# Patient Record
Sex: Female | Born: 1970 | Race: White | Hispanic: No | Marital: Single | Smoking: Never smoker
Health system: Southern US, Community
[De-identification: ages and names within clinical notes are randomized; demographics above are authoritative.]

## PROBLEM LIST (undated history)

## (undated) ENCOUNTER — Ambulatory Visit (HOSPITAL_COMMUNITY): Payer: BLUE CROSS/BLUE SHIELD

## (undated) DIAGNOSIS — E079 Disorder of thyroid, unspecified: Secondary | ICD-10-CM

---

## 2000-04-03 ENCOUNTER — Other Ambulatory Visit: Admission: RE | Admit: 2000-04-03 | Discharge: 2000-04-03 | Payer: Self-pay | Admitting: *Deleted

## 2000-09-23 ENCOUNTER — Inpatient Hospital Stay (HOSPITAL_COMMUNITY): Admission: AD | Admit: 2000-09-23 | Discharge: 2000-09-23 | Payer: Self-pay | Admitting: *Deleted

## 2000-10-25 ENCOUNTER — Inpatient Hospital Stay (HOSPITAL_COMMUNITY): Admission: AD | Admit: 2000-10-25 | Discharge: 2000-10-28 | Payer: Self-pay | Admitting: Gynecology

## 2000-10-25 ENCOUNTER — Encounter (INDEPENDENT_AMBULATORY_CARE_PROVIDER_SITE_OTHER): Payer: Self-pay

## 2000-11-29 ENCOUNTER — Inpatient Hospital Stay (HOSPITAL_COMMUNITY): Admission: AD | Admit: 2000-11-29 | Discharge: 2000-11-29 | Payer: Self-pay | Admitting: Gynecology

## 2000-12-03 ENCOUNTER — Other Ambulatory Visit: Admission: RE | Admit: 2000-12-03 | Discharge: 2000-12-03 | Payer: Self-pay | Admitting: *Deleted

## 2001-01-31 ENCOUNTER — Encounter: Payer: Self-pay | Admitting: Family Medicine

## 2001-01-31 ENCOUNTER — Encounter: Payer: Self-pay | Admitting: Gastroenterology

## 2001-01-31 ENCOUNTER — Encounter: Payer: Self-pay | Admitting: Emergency Medicine

## 2001-01-31 ENCOUNTER — Inpatient Hospital Stay (HOSPITAL_COMMUNITY): Admission: EM | Admit: 2001-01-31 | Discharge: 2001-02-04 | Payer: Self-pay | Admitting: Emergency Medicine

## 2001-02-01 ENCOUNTER — Encounter: Payer: Self-pay | Admitting: Family Medicine

## 2001-02-14 ENCOUNTER — Encounter: Admission: RE | Admit: 2001-02-14 | Discharge: 2001-02-14 | Payer: Self-pay | Admitting: Family Medicine

## 2002-12-02 ENCOUNTER — Encounter (HOSPITAL_COMMUNITY): Admission: RE | Admit: 2002-12-02 | Discharge: 2003-03-02 | Payer: Self-pay | Admitting: Family Medicine

## 2003-01-15 ENCOUNTER — Ambulatory Visit (HOSPITAL_COMMUNITY): Admission: RE | Admit: 2003-01-15 | Discharge: 2003-01-15 | Payer: Self-pay | Admitting: Endocrinology

## 2004-01-01 ENCOUNTER — Other Ambulatory Visit: Admission: RE | Admit: 2004-01-01 | Discharge: 2004-01-01 | Payer: Self-pay | Admitting: Gynecology

## 2006-07-03 ENCOUNTER — Other Ambulatory Visit: Admission: RE | Admit: 2006-07-03 | Discharge: 2006-07-03 | Payer: Self-pay | Admitting: Gynecology

## 2010-02-19 ENCOUNTER — Encounter: Payer: Self-pay | Admitting: Family Medicine

## 2010-02-20 ENCOUNTER — Encounter: Payer: Self-pay | Admitting: Endocrinology

## 2010-06-17 NOTE — Discharge Summary (Signed)
[facility]  Patient:    Stacy Dean, Stacy Dean Visit Number: [id number] MRN: [id number]          Service Type: OBS Location: 910A 9141 01 Attending Physician:  Tonye Royalty Dictated by:   Antony Contras, [facility] Proc. Date: 10/25/00 Admit Date:  10/25/2000 Discharge Date: 10/28/2000                             Discharge Summary  DISCHARGE DIAGNOSES:          1. Postdates pregnancy.                               2. Prolonged rupture of membranes.                               3. Fetal macrosomia.                               4. Request for elective permanent                                  sterilization.  PROCEDURES:                   _______ low transverse cesarean section and                               bilateral Pomeroy-type tubal sterilization.  HISTORY OF PRESENT ILLNESS:   The patient is a 40 year old, gravid 2, para 1-0-0-1, with an LMP of February 06, 2000, [facility] October 15, 2000. 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  PRENATAL LABORATORY DATA:     Blood type O positive, antibody screen negative, RPR, HBsAg, HIV nonreactive, rubella immune. MSAFP normal. GBS is negative.  HOSPITAL COURSE/TREATMENT:    Patient was admitted on October 25, 2000 with spontaneous rupture of membranes. Cervix was 2 cm, 50%, and -3 station. Ultrasound for estimated fetal weight done in the office revealed the infant to be about 9 pounds, BPP 8/8.  The patient was admitted for augmentation with high dose Pitocin and also requested a postpartum tubal sterilization. She did fail to make progress. Cesarean section and tubal sterilization was performed by Dr. Lily Peer. Findings included an Apgar 8/9 female infant, nuchal cord x 1, meconium-stained amniotic fluid, and birth weight 9 pounds 6 ounces.  POSTPARTUM COURSE:            The patient remained afebrile, had no difficulty voiding and was able to be discharged in  satisfactory condition on her third postoperative day.  CBC:  Hematocrit 23.9, hemoglobin 7.9, WBCs 17, platelets 278,000.  DISPOSITION:                  The patient is to follow up in six weeks, continue prenatal vitamins and iron with Motrin and Tylox for pain. Dictated by:   Antony Contras, [facility] Attending Physician:  Tonye Royalty DD:  11/16/00 TD:  11/18/00 Job: [id number]

## 2010-06-17 NOTE — Op Note (Signed)
[facility]  Patient:    Stacy Dean, Stacy Dean Visit Number: [id number] MRN: [id number]          Service Type: OBS Location: 910A 9141 01 Attending Physician:  Tonye Royalty Dictated by:   Gaetano Hawthorne. Lily Peer, M.D. Proc. Date: 10/25/00 Admit Date:  10/25/2000                             Operative Report  SURGEON:                      Juan H. Lily Peer, M.D.  INDICATION FOR OPERATION:     The patient is a 40 year old, gravida 2, para 1, at 41-1/2 weeks estimated gestational age, has spontaneous rupture of membranes at approximately 0200 hours on September 25, presented to the office at [facility] this morning and was found to be grossly ruptured with clear amniotic fluid, positive Nitrazine, positive ferning. The patient was started on Pitocin augmentation and did not progress beyond 3 cm dilated, ballotable presentation, suspected fetal macrosomia with an estimated fetal weight of 4100+ grams. The patient was started on pen G antibiotics IV per protocol after 12 hours of ruptured membranes. Reassuring fetal heart rate tracing otherwise.  PREOPERATIVE DIAGNOSES:       1. Postdate pregnancy.                               2. Prolonged rupture of membranes.                               3. Suspected fetal macrosomia.                               4. Request for elective permanent sterilization.  POSTOPERATIVE DIAGNOSES:      1. Postdate pregnancy.                               2. Prolonged rupture of membranes.                               3. Suspected fetal macrosomia.                               4. Request for elective permanent sterilization.                               5. Meconium-stained amniotic fluid.                               6. Nuchal cord x 1.  PROCEDURE PERFORMED:          1. Primary lower uterine segment transverse                                  cesarean section.  2. Bilateral  tubal sterilization procedure,                                  Pomeroy technique.  ANESTHESIA:                   Spinal.  ESTIMATED BLOOD LOSS:         850 cc.  INTRAVENOUS FLUIDS:            2700 cc lactated Ringers.  URINE OUTPUT:                 300 cc and clear.  FINDINGS:                     Female infant, Apgars of 8 and 9, weight pending at the time of this dictation. Arterial cord pH of 7.35. Meconium-stained amniotic fluid. Nuchal cord x 1. Normal maternal pelvic anatomy.  DESCRIPTION OF PROCEDURE:     After the patient was adequately counseled, she was taken to the operating room where she underwent successful spinal anesthesia placement. She was placed in the supine position. The abdomen was prepped and draped in the usual sterile fashion. A Foley catheter was inserted for monitorization of urinary output. After the abdomen was prepped and draped in the usual sterile fashion, a Pfannenstiel skin incision was made 2 cm above the symphysis pubis. The incision was carried down from the skin, subcutaneous tissue down to the rectus fascia whereby midline nick was made. The fascia was incised in a transverse fashion. The midline raphe was entered. The peritoneal cavity was entered cautiously and the bladder flap was established. A transverse lower uterine segment incision was made. Meconium fluid was noted. The newborn was delivered and bulb suctioned the nasopharyngeal region. The cord prior to this was reduced from the neck. The newborn gave an immediate cry after cord was clamped and cut and passed off the neonatologist who was in attendance who gave the above mentioned parameters. After cord blood was obtained, the placenta was delivered from the intrauterine cavity. The Pitocin drip was initiated. The uterus was exteriorized. The intrauterine cavity was swept clear of remaining products of conception and the lower uterine segment was closed in a single layer locking fashion  of 0 Vicryl suture.  Attention was then placed at the proximal one-third portion of the left fallopian tube which was placed under tension with a Babcock clamp and a 2 cm segment was suture ligated with 3-0 Vicryl suture x 2 and this 2 cm segment was excised and passed off the operative field for histological evaluation and the remaining stumps were Bovie cauterized. A similar procedure was carried out on the contralateral side. The uterus was then placed back into the abdominal cavity and the pelvic cavity was copiously irrigated with normal saline solution. Assessment of the lower uterine segment and tubal sterilization site with good hemostasis. The visceral peritoneum was not reapproximated and the fascia was closed with a running stitch of 0 Vicryl suture. The subcutaneous bleeders were Bovie cauterized. The skin was reapproximated with skin clips followed by placement of Xerofoam gauze and 4 x 8 dressing. The patient was transferred to recovery room with stable vital signs. Blood loss was 850 cc. IV fluids was 2700 cc of lactated Ringers and urine output was 300 cc and clear. The patient, prior to her surgery, had received her last dose of IV antibiotics of  2.5 million units of pen G. Dictated by:   Gaetano Hawthorne Lily Peer, M.D. Attending Physician:  Tonye Royalty DD:  10/25/00 TD:  10/25/00 Job: [phone] [id number]

## 2010-06-17 NOTE — H&P (Signed)
[place]. [facility]  Patient:    Stacy Dean, Stacy Dean Visit Number: [id number] MRN: [id number]          Service Type: MED Location: 3300 [id number] 01 Attending Physician:  McDiarmid, Leighton Roach. Dictated by:   Mont Dutton, M.D. Admit Date:  01/31/2001                           History and Physical  DATE OF BIRTH:  1970/11/21  SERVICE:  [facility].  PRIMARY CARE Azhia Siefken:  [facility].  CHIEF COMPLAINT:  Shortness of breath.  HISTORY OF PRESENT ILLNESS:  [age]-year-old with shortness of breath, increased over past day.  Diagnosis of bronchitis two weeks ago and was placed on steroid shot, breathing treatment in the office and then an albuterol inhaler for home as well as an unknown antibiotic.  Patient has been taking albuterol MDI two puffs q.4h. with good relief until this past night.  She has noted increased sinus drainage in the past two days, also has had some cough with green sputum production.  No fever, chills or weight loss.  Only sick contact is patients daughter but her daughter has diarrhea at this time.  No respiratory illnesses in the family or otherwise.  Patient was at work [employer]ar[employer] manager; she is an Human resources officer at [employer].  No prior intubations.  No TB exposure.  She does complain of dyspnea on exertion, orthopnea.  Denies any recent chemical exposures.  Presented to ER secondary to her worsening respiratory status.  PAST MEDICAL HISTORY: 1. Asthma as a child.  No intubations.  No hospitalizations. 2. Tubal ligation. 3. History of C-section three months ago.  No other past medical history.  ALLERGIES:  No known drug allergies.  MEDICATIONS:  Albuterol MDI two puffs q.4h. p.r.n. started two weeks ago when diagnosed with bronchitis.  FAMILY HISTORY:  Positive family history of asthma, two brothers with asthma and both outgrew in childhood, however, patients father has asthma which  is persistent.  Maternal grandmother with diabetes mellitus, hypertension, breast cancer.  However, no emphysema or chronic bronchitis in the family.  SOCIAL HISTORY:  Patient has been a smoker on and off; when she does smoke, it is approximately one pack per day.  She has had multiple attempts at quitting. She denies alcohol or drugs.  Lives with her husband and three children. Works at [employer] since 1989.  No recent exposures.  REVIEW OF SYSTEMS:  Positive shortness of breath.  Positive dyspnea on exertion.  Positive orthopnea.  Also with productive cough, green by history. No nausea or vomiting.  No chest pain.  No calf pain.  No calf erythema or warmness.  No fever or chills.  No weight loss.  No diarrhea.  PHYSICAL EXAMINATION:  VITALS:  On presentation to ER, temperature 97.1, pulse 97, blood pressure 132/88, respiratory rate 30, labored respirations, 86% saturations on room air.  GENERAL:  Speaking in short two- to three-word sentences, however, she was responsive and alert.  HEENT:  Normocephalic, atraumatic.  PERRL.  EOMI.  No posterior oropharynx erythema or drainage.  Moist oral mucosa.  NECK:  No thyromegaly.  No lymphadenopathy.  RESPIRATORY:  Diffuse wheezes throughout lungs in upper and lower segments. Tachypneic.  Positive accessory muscle use with subcostal retractions.  Air flow was fair bilaterally.  CARDIOVASCULAR:  Regular rate and rhythm, however, these were faint heart sounds as they were obscured by  the wheezing.  Peripheral pulses were intact.  ABDOMEN:  Soft, nontender, nondistended.  Scarring of lower abdomen from C-section.  EXTREMITIES:  No clubbing, cyanosis, or edema.  No calf tenderness or erythema noted.  NEUROLOGIC:  Nonfocal.  No apparent abnormalities.  LABORATORIES AND STUDIES:  ABG on i-STAT:  PCO2 37.5, PO2 58, bicarb 23, pH 7.403, O2 saturation 90% on room air.  Calculated A-A gradient 36.  Chest x-ray:  Somewhat  hyper-expanded but no apparent infiltrates.  Sputum culture Gram stain:  A few gram-positive cocci in pairs and clusters, many white blood cells, predominant PMNs.  Blood cultures pending.  Urine pregnancy test pending.  Urinalysis pending.  CBC:  White blood cells 15.2, hemoglobin 14.1, hematocrit 41.7, platelets 351,000, 87% neutrophils, ANC of 13.2, 8% lymphs.  BMP:  Sodium 135, potassium 3.5, chloride 106, bicarb 24, BUN 13, creatinine 1.2, glucose 101, AST 29, ALT 18, alkaline phosphatase 84, total bilirubin 0.3.  ASSESSMENT AND PLAN:  [age]-year-old female with a past medical history of asthma as a child, bronchitis two weeks ago, with now worsening dyspnea x1 day.  Moderate respiratory distress.  Patient with acute worsening of dyspnea on exertion and shortness of breath.  Patient with history of asthma, also with a significant family history of asthma and reactive airways.  High risk for reactive airway, given line of work.  Patient is postpartum about three months ago.  Pulmonary embolus is possible, however, denies any chest pain, calf tenderness, erythema or warmth.  Etiology/trigger of current episode not fully known at this time, possible viral illness, as reported increased sinus drainage x2 days.  Patient is on continuous nebulizer treatments in the emergency room.  Patient will be admitted on continuous albuterol nebulizer treatments, also given 60 mg of prednisone in the emergency room.  Will give 60 mg of prednisone q.8h. for one day, then consider decreasing.  Also given Atrovent q.6h. nebulizer treatments.  Patient placed on doxycycline 100 mg b.i.d. for possible community-acquired pneumonia, although no evidence of pneumonia on chest x-ray.  Given results of sputum culture and Gram stain, also started on Rocephin 1 g intramucularly/intravenously q.24h; will give intramuscularly for first dose as difficulty obtaining intravenous in this patient.  Doubt bacterial  sinusitis as less than three-day history of symptoms and no sinus tenderness on exam.  Admit to step-down while on continuous  albuterol nebulizers.  May consider other bed placement once off of this treatment. Dictated by:   Mont Dutton, M.D. Attending Physician:  McDiarmid, Tawanna Cooler D. DD:  01/31/01 TD:  01/31/01 Job: [id number]

## 2010-06-17 NOTE — Discharge Summary (Signed)
[place]. [facility]  Patient:    SHAWNTIA, MANGAL Visit Number: [id number] MRN: [id number]          Service Type: MED Location: 5500 [id number] [id number] Attending Physician:  McDiarmid, Leighton Roach. Dictated by:   Juanell Fairly, M.D. Admit Date:  01/31/2001 Discharge Date: 02/04/2001   CC:         [facility]                           Discharge Summary  INCOMPLETE REPORT.  DATE OF BIRTH:  February 01, 1970.  DISCHARGE MEDICATIONS: 1. Albuterol inhaler. 2. Azithromax 250 mg p.o. x1 dose on February 05, 2001. 3. Prednisone 60 mg x2 days.  FOLLOW-UP:  She was discharged with a follow-up appointment to see Dr. Faustino Congress Friday at 2:15 p.m.  DISCHARGE DIAGNOSIS:  Acute exacerbation of asthma, new onset asthma.  HOSPITAL COURSE:  Ms. Chrissa Meetze is a 40 year old female who presented Dictated by:   Juanell Fairly, M.D. Attending Physician:  McDiarmid, Tawanna Cooler D. DD:  02/05/01 TD:  02/06/01 Job: [id number]

## 2010-06-17 NOTE — H&P (Signed)
[facility]  Patient:    Stacy Dean, Stacy Dean Visit Number: [id number] MRN: [id number]          Service Type: OBS Location: 910B 9154 01 Attending Physician:  Tonye Royalty Dictated by:   Maryelizabeth Rowan, N.P. Admit Date:  10/25/2000                           History and Physical  CHIEF COMPLAINT:              Spontaneous rupture of membranes, clear fluid.  HISTORY OF PRESENT ILLNESS:   She is 40 years old.  G1, P1, with an LMP of February 06, 2000, and an [facility] of October 15, 2000, which puts her at 41-3/7 today.  She was awakened during the night about 2 a.m. with clear fluid, has been leaking ever since.  Denies contractions.  Presented to the office.  On sterile speculum exam clear fluid was noted.  Positive fern, positive nitrazine tape.  Her cervix is 2 cm, 50%, -3.  An ultrasound for estimated fetal weight was done here at the office which showed the baby to be about 9 pounds.  BPP was 8/8.  OBSTETRICAL LABORATORIES:     She is O positive, antibody negative.  Tocolysis negative.  VDRL nonreactive.  Rubella titer positive.  Hepatitis, HIV negative.  Her one-hour Glucola was 88.  MSAFP was within normal limits.  Her GBS, which was done on September 11, 2000, was negative.  OBSTETRICAL HISTORY:          She did delivery an 8-pound 14-ounce baby vaginally in 1990.  No history of STDs.  PAST MEDICAL HISTORY:         Negative.  PAST SURGICAL HISTORY:        She did have a LEEP procedure in 1992, and she had ankle surgery in 1985.  FAMILY HISTORY:               Negative.  PHYSICAL EXAMINATION:  GENERAL:                      Well-appearing.  In no acute distress. Comfortable.  HEENT:                        Unremarkable.  NECK:                         Thyroid nontender and mobile.  HEART:                        Regular rate and rhythm.  LUNGS:                        Clear.  BREASTS:                      Without mass, discharge,  retractions in supine and upright positions.  ABDOMEN:                      Gravid.  Soft and nontender.  Fundal height of 42.  Fetal heart tones were auscultated.  EXTREMITIES:                  With +1 edema.  PLAN:  Admit for augmentation with high-dose Pitocin. She does request a postpartum tubal ligation. Dictated by:   Maryelizabeth Rowan, N.P. Attending Physician:  Tonye Royalty DD:  10/25/00 TD:  10/25/00 Job: [id number]

## 2010-06-17 NOTE — H&P (Signed)
[facility]  Patient:    Stacy Dean, Stacy Dean Visit Number: [id number] MRN: [id number]          Service Type: Attending:  Gaetano Hawthorne. Lily Peer, M.D.                           History and Physical  INCOMPLETE  CHIEF COMPLAINT:  Rupture of membranes.  She is 41+ weeks.  HISTORY OF PRESENT ILLNESS:  Woke up at about 2 a.m. with leakage of clear fluid.  Denies contractions.  She is 40 years old.  G2, P1, with an LMP of February 06, 2000. Attending:  Gaetano Hawthorne. Lily Peer, M.D. DD:  10/25/00 TD:  10/25/00 Job: [id number]

## 2010-06-17 NOTE — Discharge Summary (Signed)
[place]. [facility]  Patient:    Stacy Dean, Stacy Dean Visit Number: [id number] MRN: [id number]          Service Type: MED Location: 5500 5504 01 Attending Physician:  McDiarmid, Leighton Roach. Dictated by:   Juanell Fairly, M.D. Admit Date:  01/31/2001 Discharge Date: 02/04/2001   CC:         Dr. Faustino Congress, [facility]                           Discharge Summary  DATE OF BIRTH:  01-17-1971.  DISCHARGE MEDICATIONS: 1. Albuterol inhaler q.4h. p.r.n. 2. Zithromax 250 mg p.o. x1 dose on February 05, 2001, at 10 a.m. 3. Prednisone 60 mg p.o. q.d. x2 days.  FOLLOW-UP:  She was discharged with a follow-up appointment to see Dr. Faustino Congress Friday, February 08, 2001, at 2:15 p.m.  DISCHARGE DIAGNOSIS:  Asthma, acute exacerbation.  HOSPITAL COURSE:  Stacy Dean is a 40 year old female who presented with shortness of breath, increasing over the course of the day prior to admission. She was diagnosed with bronchitis two weeks prior to admission and given a steroid shot and breathing treatment in the office and MDI for her to take home.  She had been taking albuterol MDI two puffs every four hours with good relief until one night prior to admission when it became more and more difficult for her to breath. The patient was admitted and begun on prednisone, albuterol continuously and Atrovent q.6h.  She was placed on Doxycycline, given a single shot of Rocephin. The albuterol nebs were then spaced to q.1 p.r.n. and the patient began having increased work of breathing, increased difficulty breathing.  More retractions, became diaphoretic. She was placed back on continuous nebs.  A D-dimer was obtained which was positive.  The patient had a PICC line placed for IV access and had a PE study done which was negative.  She continued to be diaphoretic.  Her respiratory rate was greater than 40 beats per minute and she became tachycardic.  Her O2 sats  were improving with SAO2 of 95 to 100%. She was transferred to the ICU. The patient was given 2 g of magnesium sulfate and 0.25 mg of Xanax.  By February 01, 2001, the patient was started on incentive spirometry. Azithromycin was added to her regimen and she became much improved.  Therefore, she was transferred to the step-down unit.  The patient never had to be intubated.  Her albuterol was spaced. Doxycycline was discontinued after five days of treatment. She was weaned off oxygen.  The PICC line was discontinued and the patient was sent home on February 04, 2001. Dictated by:   Juanell Fairly, M.D. Attending Physician:  McDiarmid, Tawanna Cooler D. DD:  02/05/01 TD:  02/06/01 Job: [id number]

## 2016-02-22 ENCOUNTER — Other Ambulatory Visit: Payer: Self-pay | Admitting: Family Medicine

## 2016-02-22 DIAGNOSIS — N63 Unspecified lump in unspecified breast: Secondary | ICD-10-CM

## 2016-02-29 ENCOUNTER — Other Ambulatory Visit: Payer: Self-pay

## 2016-04-28 ENCOUNTER — Ambulatory Visit
Admission: RE | Admit: 2016-04-28 | Discharge: 2016-04-28 | Disposition: A | Discharge status: Home / Self Care | Payer: BLUE CROSS/BLUE SHIELD | Source: Ambulatory Visit | Attending: Family Medicine | Admitting: Family Medicine

## 2016-04-28 ENCOUNTER — Encounter: Payer: Self-pay | Admitting: Radiology

## 2016-04-28 DIAGNOSIS — N63 Unspecified lump in unspecified breast: Secondary | ICD-10-CM

## 2017-04-23 ENCOUNTER — Encounter (HOSPITAL_COMMUNITY): Payer: Self-pay | Admitting: Emergency Medicine

## 2017-04-23 ENCOUNTER — Other Ambulatory Visit: Payer: Self-pay

## 2017-04-23 ENCOUNTER — Ambulatory Visit (HOSPITAL_COMMUNITY)
Admission: EM | Admit: 2017-04-23 | Discharge: 2017-04-23 | Disposition: A | Discharge status: Home / Self Care | Payer: BLUE CROSS/BLUE SHIELD | Attending: Family Medicine | Admitting: Family Medicine

## 2017-04-23 ENCOUNTER — Ambulatory Visit (INDEPENDENT_AMBULATORY_CARE_PROVIDER_SITE_OTHER): Payer: BLUE CROSS/BLUE SHIELD

## 2017-04-23 DIAGNOSIS — W11XXXA Fall on and from ladder, initial encounter: Secondary | ICD-10-CM

## 2017-04-23 DIAGNOSIS — S8392XA Sprain of unspecified site of left knee, initial encounter: Secondary | ICD-10-CM

## 2017-04-23 HISTORY — DX: Disorder of thyroid, unspecified: E07.9

## 2017-04-23 NOTE — ED Triage Notes (Signed)
States fell off a ladder yesterday and fell on right side, c/o "knot and bruise" on right hip with bilateral knee pain

## 2017-04-23 NOTE — ED Provider Notes (Signed)
[facility]    CSN: [id number] Arrival date & time: 04/23/17  1001     History   Chief Complaint Chief Complaint  Patient presents with  . Knee Pain    HPI Stacy Dean is a 47 y.o. female.   Stacy Dean presents with complaints of left knee pain and swelling after she fell off a ladder yesterday. She states she was approximately 9 feet up when she fell down, landed on her right buttock onto poles and boards on the ground. Did not hit her head or lose consciousness. She has been ambulatory since the fall. Denies any previous injury. Bruising to right knee with minimal pain as well as bruising to buttock with mild pain. Left knee has increased swelling and pain however. Took aleve this morning which mildly helped. Paid is moderate in severity, worse with weight bearing, full flexion or extension of knee. She is not on any blood thinners. Takes synthroid, otherwise without medical history.   ROS per HPI.      Past Medical History:  Diagnosis Date  . Thyroid disease     There are no active problems to display for this patient.   History reviewed. No pertinent surgical history.  OB History   None      Home Medications    Prior to Admission medications   Medication Sig Start Date End Date Taking? Authorizing Provider  levothyroxine (SYNTHROID, LEVOTHROID) 175 MCG tablet Take 175 mcg by mouth daily before breakfast.   Yes [provider]    Family History Family History  Problem Relation Age of Onset  . Breast cancer Maternal Aunt   . Breast cancer Maternal Grandmother     Social History Social History   Tobacco Use  . Smoking status: Never Smoker  . Smokeless tobacco: Never Used  Substance Use Topics  . Alcohol use: Never    Frequency: Never  . Drug use: Never     Allergies   Influenza vaccines and Prednisone   Review of Systems Review of Systems   Physical Exam Triage Vital Signs ED Triage Vitals [04/23/17 1031]  Enc  Vitals Group     BP (!) 146/87     Pulse Rate 82     Resp 18     Temp 98.9 F (37.2 C)     Temp Source Oral     SpO2 100 %     Weight      Height      Head Circumference      Peak Flow      Pain Score      Pain Loc      Pain Edu?      Excl. in GC?    No data found.  Updated Vital Signs BP (!) 146/87 (BP Location: Left Arm)   Pulse 82   Temp 98.9 F (37.2 C) (Oral)   Resp 18   LMP 04/03/2017   SpO2 100%   Visual Acuity Right Eye Distance:   Left Eye Distance:   Bilateral Distance:    Right Eye Near:   Left Eye Near:    Bilateral Near:     Physical Exam  Constitutional: She is oriented to person, place, and time. She appears well-developed and well-nourished. No distress.  HENT:  Head: Normocephalic and atraumatic.  Eyes: Pupils are equal, round, and reactive to light. Conjunctivae and EOM are normal.  Neck: Normal range of motion.  Cardiovascular: Normal rate, regular rhythm and normal heart sounds.  Pulmonary/Chest: Effort normal  and breath sounds normal.  Musculoskeletal:       Right hip: Normal.       Right knee: Normal.       Left knee: She exhibits decreased range of motion, swelling and effusion. She exhibits no ecchymosis, no deformity, no laceration, no erythema, normal alignment, no LCL laxity, normal patellar mobility, no bony tenderness and no MCL laxity. No tenderness found.       Legs: Left knee with generalized swelling; without specific tenderness on palpation; posterior knee with swelling and pain; pain with completely straight leg extended; pain with knee flexion to proximal patellar region; without laxity to mcl or lcl or pain with stress; negative anterior drawer; sensation intact; ambulatory; slight tenderness over bruising to right lateral knee but very mild, without bony tenderness and without full ROM without pain; right buttock with soft tissue bruising noted; without hip or pelvic tenderness and with full ROM to right hip without pain    Neurological: She is alert and oriented to person, place, and time.  Skin: Skin is warm and dry.     UC Treatments / Results  Labs (all labs ordered are listed, but only abnormal results are displayed) Labs Reviewed - No data to display  EKG None Radiology Dg Knee Complete 4 Views Left  Result Date: 04/23/2017 CLINICAL DATA:  Fall yesterday with knee pain, initial encounter EXAM: LEFT KNEE - COMPLETE 4+ VIEW COMPARISON:  None. FINDINGS: No acute fracture or dislocation is noted. Mild joint effusion is seen. No other soft tissue abnormality is noted. IMPRESSION: Joint effusion without acute bony abnormality. Electronically Signed   By: Alcide CleverMark  Lukens M.D.   On: 04/23/2017 11:10    Procedures Procedures (including critical care time)  Medications Ordered in UC Medications - No data to display   Initial Impression / Assessment and Plan / UC Course  I have reviewed the triage vital signs and the nursing notes.  Pertinent labs & imaging results that were available during my care of the patient were reviewed by me and considered in my medical decision making (see chart for details).     Xray without acute bony injury, effusion present. Knee sleeve placed for support and compression. discussed sprain vs meniscus injury s/p fall. Ice, elevation, aleve twice a day. Activity as tolerated. Follow up with orthopedics as needed for reevaluation if no improvement in the next 2-3 weeks. Patient verbalized understanding and agreeable to plan.  Ambulatory out of clinic without difficulty.    Final Clinical Impressions(s) / UC Diagnoses   Final diagnoses:  Sprain of left knee, unspecified ligament, initial encounter  Fall from ladder, initial encounter    ED Discharge Orders    None       Controlled Substance Prescriptions [place] Controlled Substance Registry consulted? Not Applicable   Georgetta HaberBurky, Freddie Dymek B, NP 04/23/17 1125

## 2017-04-23 NOTE — Discharge Instructions (Signed)
Ice, elevation, compression.  Aleve twice a day, take with food Please follow up with orthopedics if no improvement in the next 2-3 weeks or if worsening of pain.

## 2018-12-09 IMAGING — DX DG KNEE COMPLETE 4+V*L*
4 series · 4 of 4 positions shown · non-contrast
Comparison: None.

CLINICAL DATA: Fall yesterday with knee pain, initial encounter

EXAM:
LEFT KNEE - COMPLETE 4+ VIEW

[knee ap]
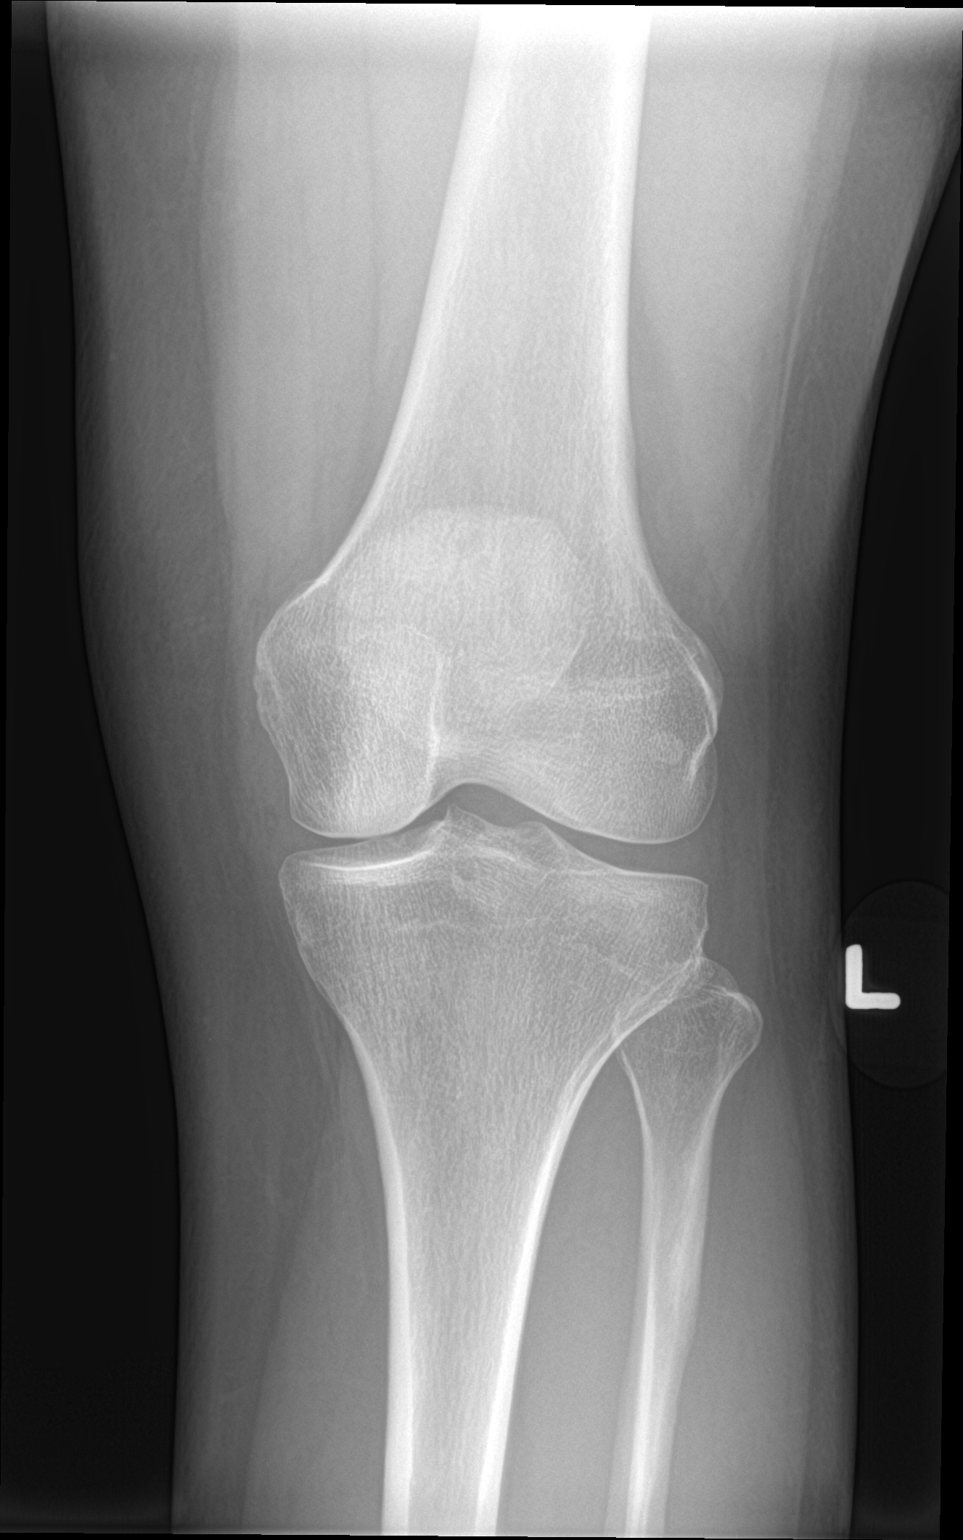

[knee obl (1 of 2)]
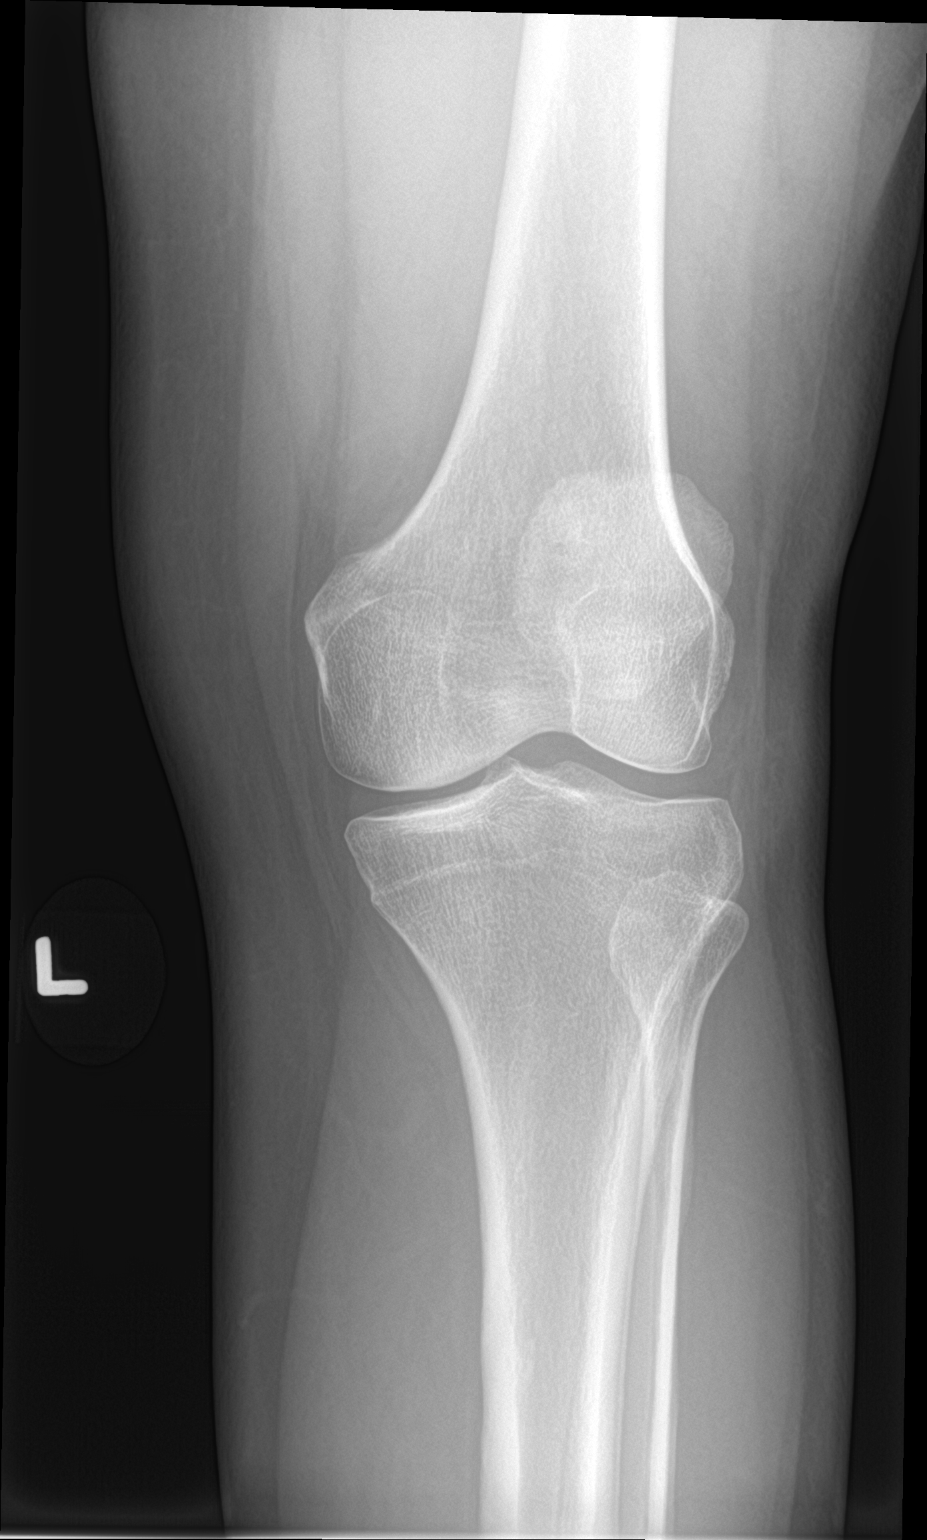

[knee obl (2 of 2)]
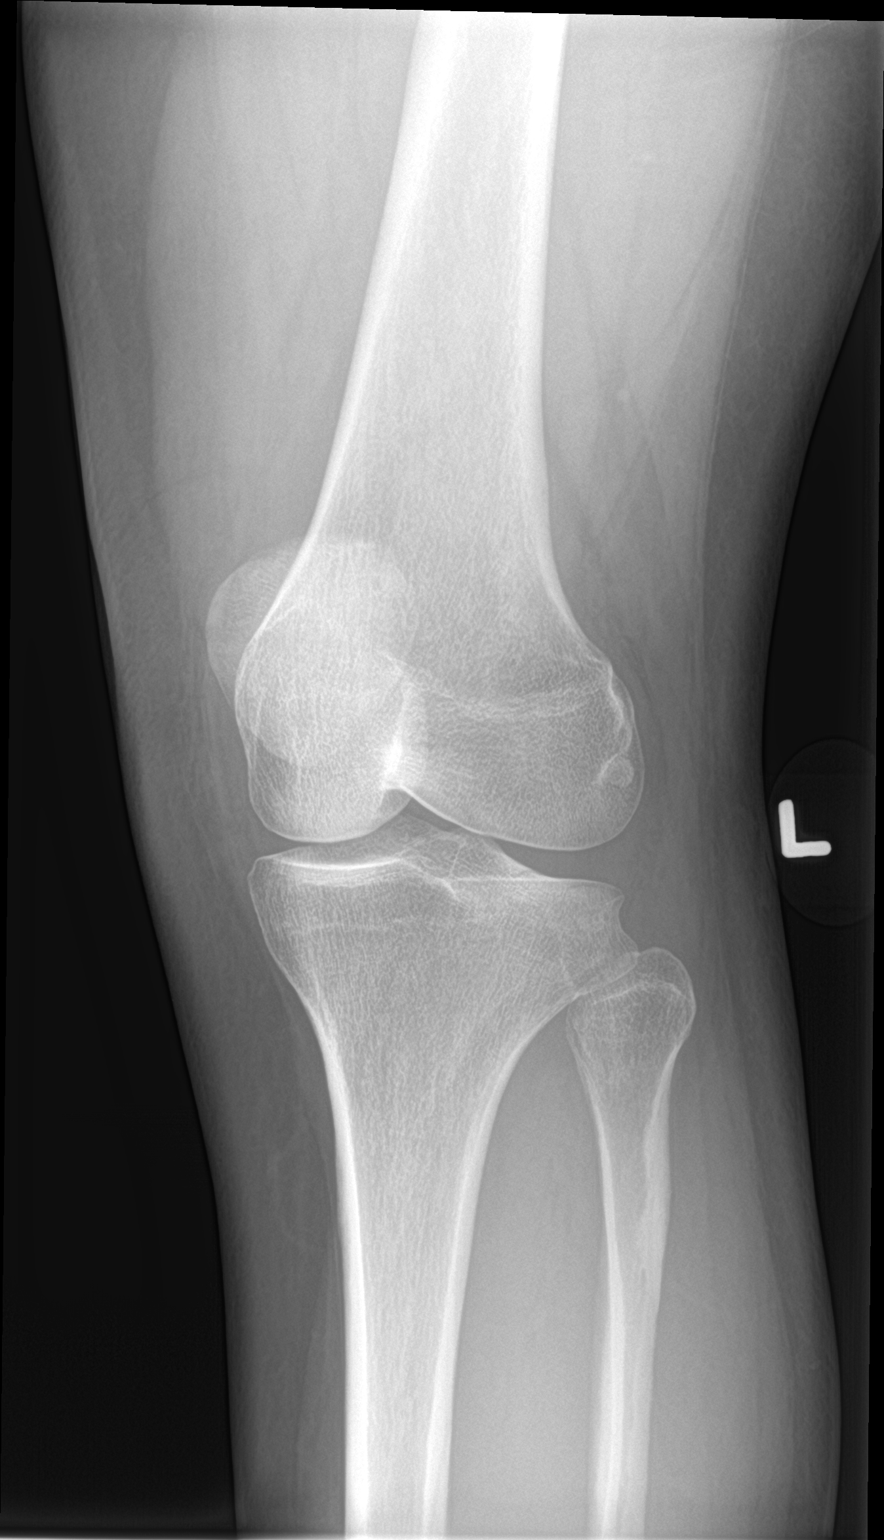

[knee lat]
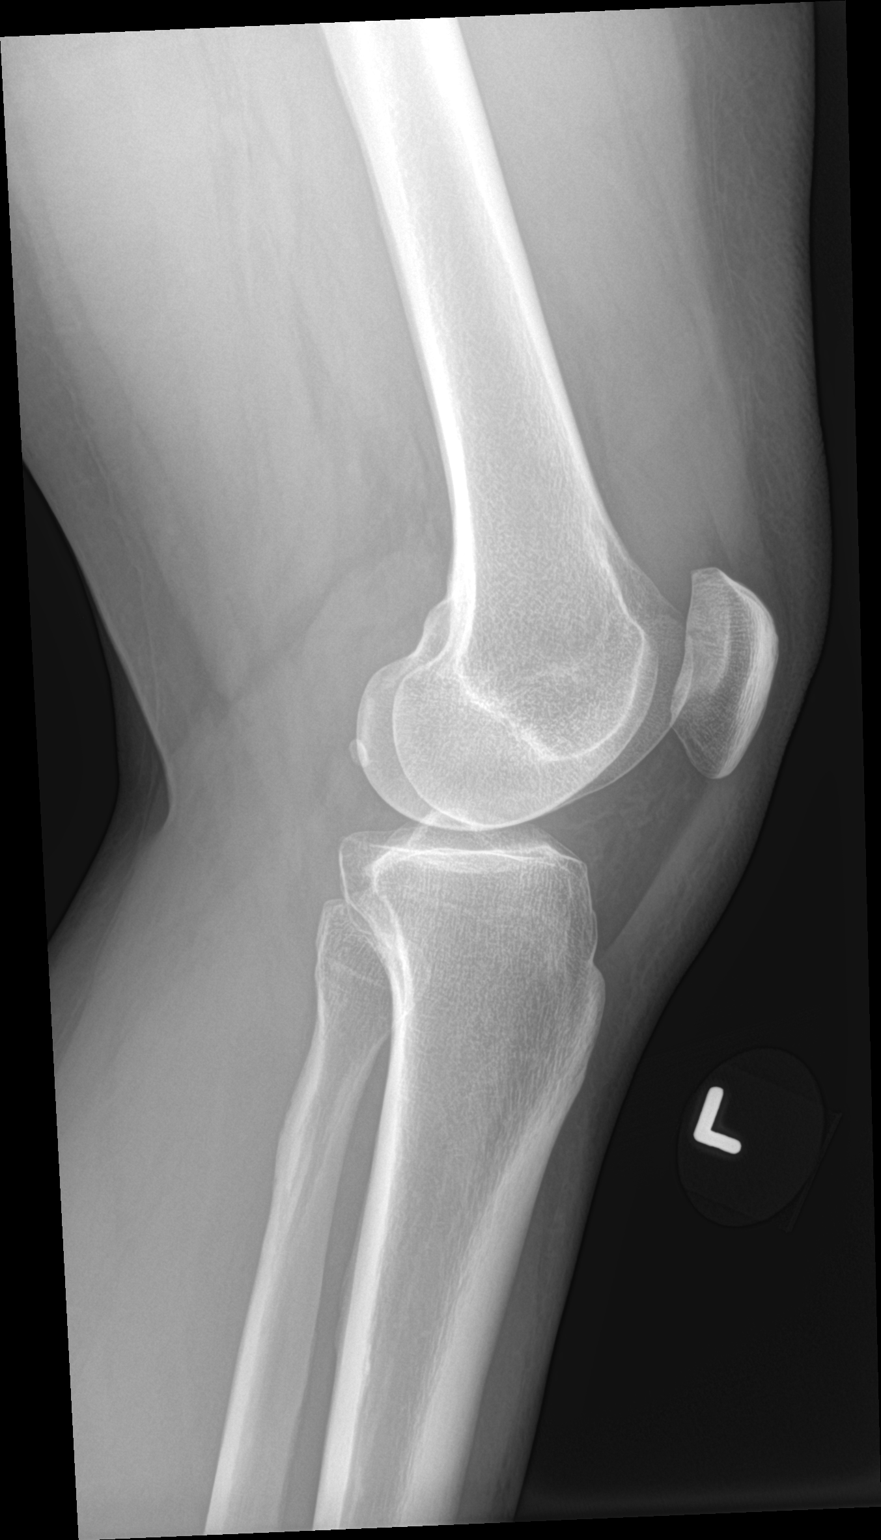

[4 of 4 positions shown; findings below may reference images not displayed]

FINDINGS: No acute fracture or dislocation is noted. Mild joint effusion is
seen. No other soft tissue abnormality is noted.
IMPRESSION: Joint effusion without acute bony abnormality.

## 2023-09-11 ENCOUNTER — Other Ambulatory Visit (HOSPITAL_COMMUNITY): Payer: Self-pay | Admitting: Family Medicine

## 2023-09-11 DIAGNOSIS — R1011 Right upper quadrant pain: Secondary | ICD-10-CM

## 2023-09-21 ENCOUNTER — Ambulatory Visit (HOSPITAL_COMMUNITY)
Admission: RE | Admit: 2023-09-21 | Discharge: 2023-09-21 | Disposition: A | Discharge status: Home / Self Care | Payer: Self-pay | Source: Ambulatory Visit | Attending: Family Medicine | Admitting: Family Medicine

## 2023-09-21 DIAGNOSIS — R1011 Right upper quadrant pain: Secondary | ICD-10-CM | POA: Insufficient documentation

## 2023-09-21 MED ORDER — TECHNETIUM TC 99M MEBROFENIN IV KIT
5.4000 | PACK | Freq: Once | INTRAVENOUS | Status: AC | PRN
  Administered 2023-09-21: 5.4 via INTRAVENOUS

## 2023-09-28 ENCOUNTER — Ambulatory Visit: Payer: Self-pay | Admitting: General Surgery

## 2023-09-28 NOTE — H&P (Signed)
 Chief Complaint: New Consultation       History of Present Illness: Stacy Dean is a 53 y.o. female who is seen today as an office consultation at the request of Dr. Loring for evaluation of New Consultation .   History of Present Illness Stacy Dean is a 53 year old female who presents with abdominal pain and nausea. She was referred for evaluation of gallbladder dysfunction.   Abdominal pain radiates to her back and has increased in frequency over the past six weeks. Nausea occurs with most meals and even with water intake, leading to vomiting and impacting her work. Fatigue and a feeling of being run down are present. During a HIDA scan, ingestion of a specific liquid exacerbated her pain. Omeprazole has been used for symptom relief but is ineffective. She takes medication to prevent sinus drainage, which she believes worsens her symptoms. No previous abdominal surgeries are noted.         Review of Systems: A complete review of systems was obtained from the patient.  I have reviewed this information and discussed as appropriate with the patient.  See HPI as well for other ROS.   Review of Systems  Constitutional:  Negative for fever.  HENT:  Negative for congestion.   Eyes:  Negative for blurred vision.  Respiratory:  Negative for cough, shortness of breath and wheezing.   Cardiovascular:  Negative for chest pain and palpitations.  Gastrointestinal:  Positive for abdominal pain, nausea and vomiting. Negative for heartburn.  Genitourinary:  Negative for dysuria.  Musculoskeletal:  Negative for myalgias.  Skin:  Negative for rash.  Neurological:  Negative for dizziness and headaches.  Psychiatric/Behavioral:  Negative for depression and suicidal ideas.   All other systems reviewed and are negative.       Medical History: Past Medical History Past Medical History: Diagnosis Date  Asthma, unspecified asthma severity, unspecified whether complicated, unspecified whether  persistent ([facility])    Hypertension         Problem List There is no problem list on file for this patient.     Past Surgical History Past Surgical History: Procedure Laterality Date  CESAREAN SECTION   2002      Allergies Allergies Allergen Reactions  Influenza Virus Vaccines Rash      Medications Ordered Prior to Encounter Current Outpatient Medications on File Prior to Visit Medication Sig Dispense Refill  levothyroxine (SYNTHROID) 125 MCG tablet TAKE 1 TABLET BY MOUTH IN THE MORNING ON AN EMPTY STOMACH (DUE FOR OFFICE VISIT MARCH 2025)      metoprolol TARTrate (LOPRESSOR) 100 MG tablet Take 100 mg by mouth as needed      omeprazole (PRILOSEC) 10 MG DR capsule Take 10 mg by mouth once daily      ondansetron (ZOFRAN-ODT) 8 MG disintegrating tablet        promethazine (PHENERGAN) 25 MG tablet TAKE 1 TABLET BY MOUTH EVERY 4 HOURS AS NEEDED FOR NAUSEA      pseudoephedrine (SUDAFED) 30 mg tablet Take 30 mg by mouth 2 (two) times daily        No current facility-administered medications on file prior to visit.      Family History Family History Problem Relation Age of Onset  Myocardial Infarction (Heart attack) Mother    High blood pressure (Hypertension) Father    Hyperlipidemia (Elevated cholesterol) Father    Breast cancer Maternal Aunt    Breast cancer Maternal Grandmother        Tobacco Use History Social History  Tobacco Use Smoking Status Former  Current packs/day: 0.00  Types: Cigarettes  Quit date: 2008  Years since quitting: 17.6 Smokeless Tobacco Never      Social History Social History    Socioeconomic History  Marital status: Single Tobacco Use  Smoking status: Former     Current packs/day: 0.00     Types: Cigarettes     Quit date: 2008     Years since quitting: 17.6  Smokeless tobacco: Never Vaping Use  Vaping status: Never Used Substance and Sexual Activity  Alcohol use: Never  Drug use: Never    Social Drivers of  Health    Housing Stability: Unknown (09/28/2023)   Housing Stability Vital Sign    Homeless in the Last Year: No      Objective:     Vitals:   09/28/23 0836 BP: (!) 164/92 Pulse: 87 Temp: 36.7 C (98.1 F) SpO2: 99% Weight: 73.7 kg (162 lb 6.4 oz) Height: 157.5 cm (5' 2) PainSc: 1    Body mass index is 29.7 kg/m.   Physical Exam  PE:   Constitutional: No acute distress, conversant, appears states age. Eyes: Anicteric sclerae, moist conjunctiva, no lid lag Lungs: Clear to auscultation bilaterally, normal respiratory effort CV: regular rate and rhythm, no murmurs, no peripheral edema, pedal pulses 2+ GI: Soft, no masses or hepatosplenomegaly, non-tender to palpation Skin: No rashes, palpation reveals normal turgor Psychiatric: appropriate judgment and insight, oriented to person, place, and time   Assessment and Plan: Diagnoses and all orders for this visit:   Biliary dyskinesia     Stacy Dean is a 53 y.o. female    1.  We will proceed to the OR for a lap cholecystectomy. 2. All risks and benefits were discussed with the patient to generally include: infection, bleeding, possible need for post op ERCP, damage to the bile ducts, and bile leak. Alternatives were offered and described.  All questions were answered and the patient voiced understanding of the procedure and wishes to proceed at this point with a laparoscopic cholecystectomy           No follow-ups on file.   Lynda Leos, MD, [facility], [place] [facility]
